# Patient Record
Sex: Male | Born: 1980 | Race: Black or African American | Hispanic: No | Marital: Married | Smoking: Current every day smoker
Health system: Southern US, Community
[De-identification: ages and names within clinical notes are randomized; demographics above are authoritative.]

## PROBLEM LIST (undated history)

## (undated) DIAGNOSIS — J302 Other seasonal allergic rhinitis: Secondary | ICD-10-CM

## (undated) DIAGNOSIS — A6000 Herpesviral infection of urogenital system, unspecified: Secondary | ICD-10-CM

---

## 2009-10-01 ENCOUNTER — Emergency Department (HOSPITAL_COMMUNITY): Admission: EM | Admit: 2009-10-01 | Discharge: 2009-10-01 | Payer: Self-pay | Admitting: Emergency Medicine

## 2009-11-05 ENCOUNTER — Emergency Department (HOSPITAL_COMMUNITY): Admission: EM | Admit: 2009-11-05 | Discharge: 2009-11-05 | Payer: Self-pay | Admitting: Emergency Medicine

## 2010-04-14 LAB — URINALYSIS, ROUTINE W REFLEX MICROSCOPIC
Bilirubin Urine: NEGATIVE
Glucose, UA: NEGATIVE mg/dL
Hgb urine dipstick: NEGATIVE
Specific Gravity, Urine: 1.025 (ref 1.005–1.030)
pH: 6 (ref 5.0–8.0)

## 2010-04-14 LAB — GC/CHLAMYDIA PROBE AMP, GENITAL
Chlamydia, DNA Probe: NEGATIVE
GC Probe Amp, Genital: NEGATIVE

## 2010-10-25 ENCOUNTER — Inpatient Hospital Stay (INDEPENDENT_AMBULATORY_CARE_PROVIDER_SITE_OTHER)
Admission: RE | Admit: 2010-10-25 | Discharge: 2010-10-25 | Disposition: A | Discharge status: Home / Self Care | Payer: Self-pay | Source: Ambulatory Visit | Attending: Family Medicine | Admitting: Family Medicine

## 2010-10-25 DIAGNOSIS — J309 Allergic rhinitis, unspecified: Secondary | ICD-10-CM

## 2011-01-06 ENCOUNTER — Emergency Department (HOSPITAL_BASED_OUTPATIENT_CLINIC_OR_DEPARTMENT_OTHER)
Admission: EM | Admit: 2011-01-06 | Discharge: 2011-01-06 | Disposition: A | Discharge status: Home / Self Care | Payer: Self-pay | Attending: Emergency Medicine | Admitting: Emergency Medicine

## 2011-01-06 ENCOUNTER — Encounter: Payer: Self-pay | Admitting: Emergency Medicine

## 2011-01-06 ENCOUNTER — Emergency Department (INDEPENDENT_AMBULATORY_CARE_PROVIDER_SITE_OTHER): Payer: Self-pay

## 2011-01-06 DIAGNOSIS — S139XXA Sprain of joints and ligaments of unspecified parts of neck, initial encounter: Secondary | ICD-10-CM | POA: Insufficient documentation

## 2011-01-06 DIAGNOSIS — Y92410 Unspecified street and highway as the place of occurrence of the external cause: Secondary | ICD-10-CM | POA: Insufficient documentation

## 2011-01-06 DIAGNOSIS — S161XXA Strain of muscle, fascia and tendon at neck level, initial encounter: Secondary | ICD-10-CM

## 2011-01-06 DIAGNOSIS — M545 Low back pain: Secondary | ICD-10-CM

## 2011-01-06 DIAGNOSIS — M549 Dorsalgia, unspecified: Secondary | ICD-10-CM | POA: Insufficient documentation

## 2011-01-06 DIAGNOSIS — M542 Cervicalgia: Secondary | ICD-10-CM

## 2011-01-06 MED ORDER — HYDROCODONE-ACETAMINOPHEN 5-500 MG PO TABS: 1.0000 | ORAL_TABLET | Freq: Four times a day (QID) | ORAL | Status: AC | PRN

## 2011-01-06 NOTE — ED Notes (Signed)
Patient transported to X-ray 
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
## 2012-01-11 ENCOUNTER — Emergency Department (HOSPITAL_COMMUNITY)
Admission: EM | Admit: 2012-01-11 | Discharge: 2012-01-11 | Disposition: A | Discharge status: Home / Self Care | Payer: Self-pay | Attending: Emergency Medicine | Admitting: Emergency Medicine

## 2012-01-11 ENCOUNTER — Emergency Department (HOSPITAL_COMMUNITY): Payer: Self-pay

## 2012-01-11 ENCOUNTER — Encounter (HOSPITAL_COMMUNITY): Payer: Self-pay

## 2012-01-11 DIAGNOSIS — F17200 Nicotine dependence, unspecified, uncomplicated: Secondary | ICD-10-CM | POA: Insufficient documentation

## 2012-01-11 DIAGNOSIS — R05 Cough: Secondary | ICD-10-CM | POA: Insufficient documentation

## 2012-01-11 DIAGNOSIS — J069 Acute upper respiratory infection, unspecified: Secondary | ICD-10-CM | POA: Insufficient documentation

## 2012-01-11 DIAGNOSIS — J029 Acute pharyngitis, unspecified: Secondary | ICD-10-CM | POA: Insufficient documentation

## 2012-01-11 DIAGNOSIS — Z8619 Personal history of other infectious and parasitic diseases: Secondary | ICD-10-CM | POA: Insufficient documentation

## 2012-01-11 DIAGNOSIS — R059 Cough, unspecified: Secondary | ICD-10-CM | POA: Insufficient documentation

## 2012-01-11 HISTORY — DX: Other seasonal allergic rhinitis: J30.2

## 2012-01-11 HISTORY — DX: Herpesviral infection of urogenital system, unspecified: A60.00

## 2012-01-11 LAB — RAPID STREP SCREEN (MED CTR MEBANE ONLY): Streptococcus, Group A Screen (Direct): NEGATIVE

## 2012-01-11 MED ORDER — DESLORATADINE 5 MG PO TBDP: 5.0000 mg | ORAL_TABLET | Freq: Every day | ORAL | Status: DC

## 2012-01-11 MED ORDER — ALBUTEROL SULFATE HFA 108 (90 BASE) MCG/ACT IN AERS: 2.0000 | INHALATION_SPRAY | RESPIRATORY_TRACT | Status: DC | PRN

## 2012-01-11 MED ORDER — FLUTICASONE PROPIONATE 50 MCG/ACT NA SUSP: 2.0000 | Freq: Every day | NASAL | Status: DC

## 2012-01-11 MED ORDER — BENZONATATE 100 MG PO CAPS: 100.0000 mg | ORAL_CAPSULE | Freq: Three times a day (TID) | ORAL | Status: DC | PRN

## 2012-01-11 NOTE — ED Provider Notes (Signed)
History     CSN: [id number]  Arrival date & time 01/11/12  0818   First MD Initiated Contact with Patient 01/11/12 [phone]      Chief Complaint  Patient presents with  . Nasal Congestion     HPI Pt was seen at 0850.  Per pt, c/o gradual onset and persistence of constant runny/stuffy nose, sore throat, sinus congestion, and cough for the past 2 weeks.  Has had intermittent epistaxis.  Denies fevers, no rash, no CP/SOB, no abd pain, no N/V/D.     Past Medical History  Diagnosis Date  . Seasonal allergies   . Genital herpes     History reviewed. No pertinent past surgical history.    History  Substance Use Topics  . Smoking status: Current Every Day Smoker -- 0.5 packs/day  . Smokeless tobacco: Not on file  . Alcohol Use: No    Review of Systems ROS: Statement: All systems negative except as marked or noted in the HPI; Constitutional: Negative for fever and chills. ; ; Eyes: Negative for eye pain, redness and discharge. ; ; ENMT: Negative for ear pain, hoarseness, +nasal congestion, sinus pressure and sore throat. ; ; Cardiovascular: Negative for chest pain, palpitations, diaphoresis, dyspnea and peripheral edema. ; ; Respiratory: +cough. Negative for wheezing and stridor. ; ; Gastrointestinal: Negative for nausea, vomiting, diarrhea, abdominal pain, blood in stool, hematemesis, jaundice and rectal bleeding. . ; ; Genitourinary: Negative for dysuria, flank pain and hematuria. ; ; Musculoskeletal: Negative for back pain and neck pain. Negative for swelling and trauma.; ; Skin: Negative for pruritus, rash, abrasions, blisters, bruising and skin lesion.; ; Neuro: Negative for headache, lightheadedness and neck stiffness. Negative for weakness, altered level of consciousness , altered mental status, extremity weakness, paresthesias, involuntary movement, seizure and syncope.      Allergies  Penicillins  Home Medications   Current Outpatient Rx  Name  Route  Sig  Dispense  Refill   . PHENYLEPH-CPM-DM-APAP 05-31-08-325 MG PO CAPS   Oral   Take 1 capsule by mouth 2 (two) times daily as needed. For cold           BP 131/71  Pulse 85  Temp 98.2 F (36.8 C) (Oral)  Resp 16  SpO2 100%  Physical Exam 0855: Physical examination:  Nursing notes reviewed; Vital signs and O2 SAT reviewed;  Constitutional: Well developed, Well nourished, Well hydrated, In no acute distress; Head:  Normocephalic, atraumatic; Eyes: EOMI, PERRL, No scleral icterus; ENMT: +clear fluid behind TM's bilat. +edemetous nasal turbinates bilat with clear rhinorrhea and friable mucosa.  Mouth and pharynx without lesions. No tonsillar exudates. No intra-oral edema. No hoarse voice, no drooling, no stridor.  Mouth and pharynx normal, Mucous membranes moist; Neck: Supple, Full range of motion, No lymphadenopathy; Cardiovascular: Regular rate and rhythm, No murmur, rub, or gallop; Respiratory: Breath sounds clear & equal bilaterally, No rales, rhonchi, wheezes.  Speaking full sentences with ease, Normal respiratory effort/excursion; Chest: Nontender, Movement normal;; Extremities: Pulses normal, No tenderness, No edema, No calf edema or asymmetry.; Neuro: AA&Ox3, Major CN grossly intact.  Speech clear. Gait steady. No gross focal motor or sensory deficits in extremities.; Skin: Color normal, Warm, Dry.   ED Course  Procedures   0900:  Pt demanding a "CT scan" of his head and chest for his symptoms.  Lengthy d/w pt regarding the lack of indication for this.  Again demanding "testing" because he "knows my body" and "think I have a tumor" or "walking pneumonia."  Reiterated there was no indication for imaging studies of his sinuses at this time, as well as no literature based recommendations regarding same.  Informed I will check a CXR and rapid strep to r/o pneumonia and strep throat based on his symptoms.  Pt argumentative and insistent he "needs more testing than that" for his symptoms.  Again informed that I will  check rapid strep and CXR and no further testing is indicated at this time.      1000:  No pneumonia or strep throat; will tx symptomatically for URI/viral illness at this time. Dx and testing d/w pt and family.  Questions answered.  Verb understanding, agreeable to d/c home with outpt f/u.   MDM  MDM Reviewed: previous chart, nursing note and vitals Interpretation: x-ray and labs    Results for orders placed during the hospital encounter of 01/11/12  RAPID STREP SCREEN      Component Value Range   Streptococcus, Group A Screen (Direct) NEGATIVE  NEGATIVE   Dg Chest 2 View 01/11/2012  *RADIOLOGY REPORT*  Clinical Data: Cough.  CHEST - 2 VIEW  Comparison: November 05, 2009.  Findings: Cardiomediastinal silhouette appears normal.  No acute pulmonary disease is noted.  Bony thorax is intact.  IMPRESSION: No acute cardiopulmonary abnormality seen.   Original Report Authenticated By: Lupita Raider.,  M.D.           Laray Anger, DO 01/12/12 1347

## 2012-01-11 NOTE — ED Notes (Signed)
Pt with c/o 2 weeks of congestion, green, muus, chest congestion mild SOB

## 2012-01-12 ENCOUNTER — Emergency Department (HOSPITAL_COMMUNITY): Payer: Self-pay

## 2012-01-12 ENCOUNTER — Encounter (HOSPITAL_COMMUNITY): Payer: Self-pay | Admitting: Emergency Medicine

## 2012-01-12 ENCOUNTER — Emergency Department (HOSPITAL_COMMUNITY)
Admission: EM | Admit: 2012-01-12 | Discharge: 2012-01-12 | Disposition: A | Discharge status: Home / Self Care | Payer: Self-pay | Attending: Emergency Medicine | Admitting: Emergency Medicine

## 2012-01-12 DIAGNOSIS — Z79899 Other long term (current) drug therapy: Secondary | ICD-10-CM | POA: Insufficient documentation

## 2012-01-12 DIAGNOSIS — F17200 Nicotine dependence, unspecified, uncomplicated: Secondary | ICD-10-CM | POA: Insufficient documentation

## 2012-01-12 DIAGNOSIS — Z8619 Personal history of other infectious and parasitic diseases: Secondary | ICD-10-CM | POA: Insufficient documentation

## 2012-01-12 DIAGNOSIS — R0602 Shortness of breath: Secondary | ICD-10-CM | POA: Insufficient documentation

## 2012-01-12 DIAGNOSIS — J40 Bronchitis, not specified as acute or chronic: Secondary | ICD-10-CM | POA: Insufficient documentation

## 2012-01-12 DIAGNOSIS — J209 Acute bronchitis, unspecified: Secondary | ICD-10-CM

## 2012-01-12 MED ORDER — AZITHROMYCIN 250 MG PO TABS: 250.0000 mg | ORAL_TABLET | Freq: Every day | ORAL | Status: DC

## 2012-01-12 NOTE — ED Notes (Signed)
Patient transported to X-ray 

## 2012-01-12 NOTE — ED Provider Notes (Addendum)
History     CSN: [id number]  Arrival date & time 01/12/12  2013   First MD Initiated Contact with Patient 01/12/12 2212      Chief Complaint  Patient presents with  . Cough  . Shortness of Breath    (Consider location/radiation/quality/duration/timing/severity/associated sxs/prior treatment) HPI  Gavon Majano is a 31 y.o. male complaining of productive cough and rhinorrhea onset 2 days ago. Patient had fever on the first day of symptoms now resolved. He denies chest pain, shortness of breath, abdominal pain, nausea vomiting, change in bowel or bladder habits.   Past Medical History  Diagnosis Date  . Seasonal allergies   . Genital herpes     History reviewed. No pertinent past surgical history.  No family history on file.  History  Substance Use Topics  . Smoking status: Current Every Day Smoker -- 0.5 packs/day  . Smokeless tobacco: Not on file  . Alcohol Use: No      Review of Systems  Constitutional: Negative for fever.  HENT: Positive for rhinorrhea.   Respiratory: Positive for cough. Negative for shortness of breath.   Cardiovascular: Negative for chest pain.  Gastrointestinal: Negative for nausea, vomiting, abdominal pain and diarrhea.  All other systems reviewed and are negative.    Allergies  Penicillins  Home Medications   Current Outpatient Rx  Name  Route  Sig  Dispense  Refill  . ALBUTEROL SULFATE HFA 108 (90 BASE) MCG/ACT IN AERS   Inhalation   Inhale 2 puffs into the lungs every 4 (four) hours as needed for wheezing or shortness of breath (cough).   1 Inhaler   0   . BENZONATATE 100 MG PO CAPS   Oral   Take 100 mg by mouth 3 (three) times daily as needed.         . DESLORATADINE 5 MG PO TBDP   Oral   Take 1 tablet (5 mg total) by mouth daily.   15 tablet   0   . FLUTICASONE PROPIONATE 50 MCG/ACT NA SUSP   Nasal   Place 2 sprays into the nose daily.   16 g   0   . PHENYLEPH-CPM-DM-APAP 05-31-08-325 MG PO CAPS   Oral   Take 1  capsule by mouth 2 (two) times daily as needed. For cold           Temp 99.4 F (37.4 C) (Oral)  Physical Exam  Nursing note and vitals reviewed. Constitutional: He is oriented to person, place, and time. He appears well-developed and well-nourished. No distress.  HENT:  Head: Normocephalic.  Mouth/Throat: Oropharynx is clear and moist.       Profuse clear rhinorrhea, posterior pharynx is injected, anterior cervical lymphadenopathy nontender to palpation.  Eyes: Conjunctivae normal and EOM are normal. Pupils are equal, round, and reactive to light.  Neck: Normal range of motion.  Cardiovascular: Normal rate and intact distal pulses.   No murmur heard. Pulmonary/Chest: Effort normal and breath sounds normal. No stridor. No respiratory distress. He has no wheezes. He has no rales. He exhibits no tenderness.  Abdominal: Soft. He exhibits no distension and no mass. There is no tenderness. There is no rebound and no guarding.  Musculoskeletal: Normal range of motion.  Neurological: He is alert and oriented to person, place, and time.  Psychiatric: He has a normal mood and affect.    ED Course  Procedures (including critical care time)  Labs Reviewed - No data to display Dg Chest 2 View  01/12/2012  *  RADIOLOGY REPORT*  Clinical Data: Cough, congestion, shortness of breath, mid chest pain  CHEST - 2 VIEW  Comparison: 01/11/2012, 11/05/2009  Findings: Normal heart size, mediastinal contours, and pulmonary vascularity. Minimal chronic peribronchial thickening. No infiltrate, pleural effusion, or pneumothorax. No acute osseous findings.  IMPRESSION: Minimal chronic bronchitic changes. No acute abnormalities.   Original Report Authenticated By: Ulyses Southward, M.D.    Dg Chest 2 View  01/11/2012  *RADIOLOGY REPORT*  Clinical Data: Cough.  CHEST - 2 VIEW  Comparison: November 05, 2009.  Findings: Cardiomediastinal silhouette appears normal.  No acute pulmonary disease is noted.  Bony thorax is  intact.  IMPRESSION: No acute cardiopulmonary abnormality seen.   Original Report Authenticated By: Lupita Raider.,  M.D.      1. Acute bronchitis       MDM  Reassured patient that I a shortness of breath is perception of his upper respiratory mucus. Patient states that he has albuterol and a cough suppressant at home. I have given him a prescription for azithromycin and asked him to not fill the prescription unless he is unimproved in 7 days or if she significantly worsens.   Pt verbalized understanding and agrees with care plan. Outpatient follow-up and return precautions given.     New Prescriptions   AZITHROMYCIN (ZITHROMAX Z-PAK) 250 MG TABLET    Take 1 tablet (250 mg total) by mouth daily. 500mg  PO day 1, then 250mg  PO days [address] PA-C 01/12/12 2235  Wynetta Emery, PA-C 02/06/12 1219

## 2012-01-12 NOTE — ED Notes (Signed)
Pt alert, arrives from home, c/o cough and congestion, onset several days ago, recent fever, per family productive cough, home remedies w/o relief, resp even unlabored, skin pwd

## 2012-01-14 NOTE — ED Provider Notes (Signed)
Medical screening examination/treatment/procedure(s) were performed by non-physician practitioner and as supervising physician I was immediately available for consultation/collaboration.  Tylynn Braniff T Kepler Mccabe, MD 01/14/12 1516 

## 2012-02-11 NOTE — ED Provider Notes (Signed)
Medical screening examination/treatment/procedure(s) were performed by non-physician practitioner and as supervising physician I was immediately available for consultation/collaboration.  Toy Baker, MD 02/11/12 2139

## 2016-07-09 ENCOUNTER — Encounter (HOSPITAL_COMMUNITY): Payer: Self-pay | Admitting: *Deleted

## 2016-07-09 ENCOUNTER — Ambulatory Visit (HOSPITAL_COMMUNITY)
Admission: EM | Admit: 2016-07-09 | Discharge: 2016-07-09 | Disposition: A | Discharge status: Home / Self Care | Payer: Self-pay | Attending: Internal Medicine | Admitting: Internal Medicine

## 2016-07-09 DIAGNOSIS — M5441 Lumbago with sciatica, right side: Secondary | ICD-10-CM

## 2016-07-09 MED ORDER — MELOXICAM 15 MG PO TABS
15.0000 mg | ORAL_TABLET | Freq: Every day | ORAL | 0 refills | Status: DC
Start: 2016-07-09 — End: 2021-11-19

## 2016-07-09 MED ORDER — METHOCARBAMOL 500 MG PO TABS: 500.0000 mg | ORAL_TABLET | Freq: Two times a day (BID) | ORAL | 0 refills | Status: DC

## 2016-07-09 NOTE — Discharge Instructions (Signed)
°  Meloxicam (Mobic) is an antiinflammatory to help with pain and inflammation.  Do not take ibuprofen, Advil, Aleve, or any other medications that contain NSAIDs while taking meloxicam as this may cause stomach upset or even ulcers if taken in large amounts for an extended period of time.  ° °Robaxin (methocarbamol) is a muscle relaxer and may cause drowsiness. Do not drink alcohol, drive, or operate heavy machinery while taking. ° °

## 2016-07-09 NOTE — ED Triage Notes (Signed)
Pt  Reports  Back    And      Pain  Radiates     Down  r  Leg     Symptoms    X  approx  10  Days       Denies  Any  specefic      Injury

## 2016-07-09 NOTE — ED Provider Notes (Signed)
CSN: [id number]     Arrival date & time 07/09/16  1211 History   First MD Initiated Contact with Patient 07/09/16 1316     Chief Complaint  Patient presents with  . Back Pain   (Consider location/radiation/quality/duration/timing/severity/associated sxs/prior Treatment) HPI  Isaiah Benson is a 36 y.o. male presenting to UC with c/o 10 days of waxing and waning Right lower back pain that radiates into Right buttock and Right posterior thigh.  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 Pain is 6/10 at this time but up to 8/10 at worst. Worse when trying to stretch his Right leg.  No specific known injury. He has not tried any OTC medications.  Denies urinary symptoms. Denies numbness or tingling in legs or groin.    Past Medical History:  Diagnosis Date  . Genital herpes   . Seasonal allergies    History reviewed. No pertinent surgical history. History reviewed. No pertinent family history. Social History  Substance Use Topics  . Smoking status: Current Every Day Smoker    Packs/day: 0.50  . Smokeless tobacco: Never Used  . Alcohol use No    Review of Systems  Genitourinary: Negative for dysuria, flank pain, frequency and hematuria.  Musculoskeletal: Positive for back pain and myalgias. Negative for arthralgias, gait problem, neck pain and neck stiffness.  Skin: Negative for color change, rash and wound.  Neurological: Negative for weakness and numbness.    Allergies  Penicillins  Home Medications   Prior to Admission medications   Medication Sig Start Date End Date Taking? Authorizing Provider  albuterol (PROVENTIL HFA;VENTOLIN HFA) 108 (90 BASE) MCG/ACT inhaler Inhale 2 puffs into the lungs every 4 (four) hours as needed for wheezing or shortness of breath (cough). 01/11/12   Samuel JesterMcManus, Kathleen, DO  azithromycin (ZITHROMAX Z-PAK) 250 MG tablet Take 1 tablet (250 mg total) by mouth  daily. 500mg  PO day 1, then 250mg  PO days 205 01/12/12   Pisciotta, Joni ReiningNicole, PA-C  benzonatate (TESSALON) 100 MG capsule Take 100 mg by mouth 3 (three) times daily as needed. 01/11/12   Samuel JesterMcManus, Kathleen, DO  desloratadine (CLARINEX REDITABS) 5 MG disintegrating tablet Take 1 tablet (5 mg total) by mouth daily. 01/11/12   Samuel JesterMcManus, Kathleen, DO  fluticasone (FLONASE) 50 MCG/ACT nasal spray Place 2 sprays into the nose daily. 01/11/12   Samuel JesterMcManus, Kathleen, DO  meloxicam (MOBIC) 15 MG tablet Take 1 tablet (15 mg total) by mouth daily. For 7 days, then daily as needed for pain 07/09/16   Junius Finner'Malley, Juan Kissoon, PA-C  methocarbamol (ROBAXIN) 500 MG tablet Take 1 tablet (500 mg total) by mouth 2 (two) times daily. 07/09/16   Junius Finner'Malley, Julieann Drummonds, PA-C  Phenyleph-CPM-DM-APAP (ALKA-SELTZER PLUS COLD & COUGH) 05-31-08-325 MG CAPS Take 1 capsule by mouth 2 (two) times daily as needed. For cold    [provider]   Meds Ordered and Administered this Visit  Medications - No data to display  BP 123/81 (BP Location: Right Arm)   Pulse 71   Temp 98.3 F (36.8 C) (Oral)   Resp 16   SpO2 98%  No data found.   Physical Exam  Constitutional: He is oriented to person, place, and time. He appears well-developed and well-nourished. No distress.  HENT:  Head: Normocephalic and atraumatic.  Eyes: EOM are normal.  Neck: Normal range of motion. Neck supple.  Cardiovascular: Normal rate.   Pulmonary/Chest: Effort normal.  Musculoskeletal:  Normal range of motion. He exhibits tenderness. He exhibits no edema.  No midline spinal tenderness. Tenderness to Right lower lumbar muscles, Right buttock and posterior thigh. Increased pain with flexion at the waist.  Right knee: non-tender. Full ROM  Neurological: He is alert and oriented to person, place, and time.  Skin: Skin is warm and dry. Capillary refill takes less than 2 seconds. No rash noted. He is not diaphoretic. No erythema.  Psychiatric: He has a normal mood and  affect. His behavior is normal.  Nursing note and vitals reviewed.   Urgent Care Course     Procedures (including critical care time)  Labs Review Labs Reviewed - No data to display  Imaging Review No results found.   MDM   1. Acute right-sided low back pain with right-sided sciatica    Hx and exam c/w Right low back pain with sciatica. No indication for imaging at this time. Will treat conservatively.   Rx: Meloxicam and Robaxin Home care instructions provied Advised pt not to take Robaxin within 24 hours of driving his semi-truck due to drowsiness  F/u with PCP as needed. Pt agreeable.     Junius Finner, PA-C 07/09/16 1355

## 2018-05-25 ENCOUNTER — Emergency Department (HOSPITAL_BASED_OUTPATIENT_CLINIC_OR_DEPARTMENT_OTHER): Payer: Self-pay

## 2018-05-25 ENCOUNTER — Encounter (HOSPITAL_BASED_OUTPATIENT_CLINIC_OR_DEPARTMENT_OTHER): Payer: Self-pay | Admitting: *Deleted

## 2018-05-25 ENCOUNTER — Emergency Department (HOSPITAL_BASED_OUTPATIENT_CLINIC_OR_DEPARTMENT_OTHER)
Admission: EM | Admit: 2018-05-25 | Discharge: 2018-05-25 | Disposition: A | Discharge status: Home / Self Care | Payer: Self-pay | Attending: Emergency Medicine | Admitting: Emergency Medicine

## 2018-05-25 ENCOUNTER — Other Ambulatory Visit: Payer: Self-pay

## 2018-05-25 DIAGNOSIS — R053 Chronic cough: Secondary | ICD-10-CM

## 2018-05-25 DIAGNOSIS — R05 Cough: Secondary | ICD-10-CM | POA: Insufficient documentation

## 2018-05-25 DIAGNOSIS — F17210 Nicotine dependence, cigarettes, uncomplicated: Secondary | ICD-10-CM | POA: Insufficient documentation

## 2018-05-25 NOTE — ED Notes (Signed)
Pt denies sob except at night when lying down. Pt states he uses vaporizer at home, seems to respond best to opening window with fan and using benadryl at bedtime to help sleep. Denis hx of asthma, states he was prescribed inhaler in past for URI, and allergy symptoms. Denis use at this time.

## 2018-05-25 NOTE — ED Triage Notes (Signed)
Pt since January he has had cough and SOB worse at night. Denies fever. 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 Denies fever. States he is isolated when working in his truck

## 2018-05-25 NOTE — ED Provider Notes (Signed)
[facility] EMERGENCY DEPARTMENT Provider Note   CSN: [id number] Arrival date & time: 05/25/18  1757    History   Chief Complaint Chief Complaint  Patient presents with  . Shortness of Breath    HPI Isaiah Benson is a 38 y.o. male.     HPI  38 year old male presents with cough and shortness of breath.  Symptoms have been ongoing since January 2020.  It is not better or worse.  The cough only occurs at night and occasionally will bring up clear sputum.  He does not specifically feel short of breath but more like he cannot get a full breath.  There is no blood in his sputum.  There is no fever or weight loss.  No URI symptoms such as congestion, sore throat or rhinorrhea.  Sometimes his chest will feel heavy.  None of the symptoms are occurring currently.  No leg swelling or leg pain.  He is a smoker.  Past Medical History:  Diagnosis Date  . Genital herpes   . Seasonal allergies     There are no active problems to display for this patient.   History reviewed. No pertinent surgical history.      Home Medications    Prior to Admission medications   Medication Sig Start Date End Date Taking? Authorizing Provider  albuterol (PROVENTIL HFA;VENTOLIN HFA) 108 (90 BASE) MCG/ACT inhaler Inhale 2 puffs into the lungs every 4 (four) hours as needed for wheezing or shortness of breath (cough). 01/11/12   Samuel Jester, DO  azithromycin (ZITHROMAX Z-PAK) 250 MG tablet Take 1 tablet (250 mg total) by mouth daily. 500mg  PO day 1, then 250mg  PO days 205 01/12/12   Pisciotta, Joni Reining, PA-C  benzonatate (TESSALON) 100 MG capsule Take 100 mg by mouth 3 (three) times daily as needed. 01/11/12   Samuel Jester, DO  desloratadine (CLARINEX REDITABS) 5 MG disintegrating tablet Take 1 tablet (5 mg total) by mouth daily. 01/11/12   Samuel Jester, DO  fluticasone (FLONASE) 50 MCG/ACT nasal spray Place 2 sprays into the nose daily. 01/11/12   Samuel Jester, DO  meloxicam  (MOBIC) 15 MG tablet Take 1 tablet (15 mg total) by mouth daily. For 7 days, then daily as needed for pain 07/09/16   Lurene Shadow, PA-C  methocarbamol (ROBAXIN) 500 MG tablet Take 1 tablet (500 mg total) by mouth 2 (two) times daily. 07/09/16   Lurene Shadow, PA-C  Phenyleph-CPM-DM-APAP (ALKA-SELTZER PLUS COLD & COUGH) 05-31-08-325 MG CAPS Take 1 capsule by mouth 2 (two) times daily as needed. For cold    [provider]    Family History No family history on file.  Social History Social History   Tobacco Use  . Smoking status: Current Every Day Smoker    Packs/day: 0.50    Types: Cigarettes  . Smokeless tobacco: Never Used  Substance Use Topics  . Alcohol use: Yes  . Drug use: No     Allergies   Penicillins   Review of Systems Review of Systems  Constitutional: Negative for fever and unexpected weight change.  HENT: Negative for congestion, rhinorrhea and sore throat.   Respiratory: Positive for cough and shortness of breath.   Cardiovascular: Negative for chest pain and leg swelling.  All other systems reviewed and are negative.    Physical Exam Updated Vital Signs BP (!) 142/83 (BP Location: Right Arm)   Pulse 62   Temp 98.4 F (36.9 C) (Oral)   Resp 18   Ht 6\' 4"  (  1.93 m)   Wt 90.7 kg   SpO2 100%   BMI 24.34 kg/m   Physical Exam Vitals signs and nursing note reviewed.  Constitutional:      Appearance: He is well-developed.  HENT:     Head: Normocephalic and atraumatic.     Right Ear: External ear normal.     Left Ear: External ear normal.     Nose: Nose normal.  Eyes:     General:        Right eye: No discharge.        Left eye: No discharge.  Neck:     Musculoskeletal: Neck supple.  Cardiovascular:     Rate and Rhythm: Normal rate and regular rhythm.     Heart sounds: Normal heart sounds.  Pulmonary:     Effort: Pulmonary effort is normal.     Breath sounds: Normal breath sounds.  Abdominal:     Palpations: Abdomen is soft.      Tenderness: There is no abdominal tenderness.  Skin:    General: Skin is warm and dry.  Neurological:     Mental Status: He is alert.  Psychiatric:        Mood and Affect: Mood is not anxious.      ED Treatments / Results  Labs (all labs ordered are listed, but only abnormal results are displayed) Labs Reviewed - No data to display  EKG None  Radiology Dg Chest 2 View  Result Date: 05/25/2018 CLINICAL DATA:  Cough and short of breath EXAM: CHEST - 2 VIEW COMPARISON:  01/12/2012 FINDINGS: The heart size and mediastinal contours are within normal limits. Both lungs are clear. The visualized skeletal structures are unremarkable. IMPRESSION: No active cardiopulmonary disease. Electronically Signed   By: Jasmine PangKim  Fujinaga M.D.   On: 05/25/2018 18:53    Procedures Procedures (including critical care time)  Medications Ordered in ED Medications - No data to display   Initial Impression / Assessment and Plan / ED Course  I have reviewed the triage vital signs and the nursing notes.  Pertinent labs & imaging results that were available during my care of the patient were reviewed by me and considered in my medical decision making (see chart for details).        Patient's exam and vitals are benign besides mild hypertension.  Cough has been present now for 3 months.  I advised him to stop smoking.  Given clear lungs and benign chest x-ray I do not think he needs further emergent work-up.  Given this only occurs at night, I think PE, ACS, etc. is pretty unlikely.  No true infectious signs or symptoms.  Will refer to pulmonology but otherwise counseled on stopping smoking.  Final Clinical Impressions(s) / ED Diagnoses   Final diagnoses:  Chronic cough    ED Discharge Orders    None       Pricilla LovelessGoldston, Venie Montesinos, MD 05/25/18 [phone]

## 2018-05-25 NOTE — ED Notes (Signed)
ED Provider at bedside. 

## 2019-08-10 ENCOUNTER — Encounter (HOSPITAL_BASED_OUTPATIENT_CLINIC_OR_DEPARTMENT_OTHER): Payer: Self-pay

## 2019-08-10 ENCOUNTER — Other Ambulatory Visit: Payer: Self-pay

## 2019-08-10 ENCOUNTER — Emergency Department (HOSPITAL_BASED_OUTPATIENT_CLINIC_OR_DEPARTMENT_OTHER): Payer: Self-pay

## 2019-08-10 ENCOUNTER — Emergency Department (HOSPITAL_BASED_OUTPATIENT_CLINIC_OR_DEPARTMENT_OTHER)
Admission: EM | Admit: 2019-08-10 | Discharge: 2019-08-10 | Disposition: A | Discharge status: Home / Self Care | Payer: Self-pay | Attending: Emergency Medicine | Admitting: Emergency Medicine

## 2019-08-10 DIAGNOSIS — R05 Cough: Secondary | ICD-10-CM | POA: Insufficient documentation

## 2019-08-10 DIAGNOSIS — R0602 Shortness of breath: Secondary | ICD-10-CM | POA: Insufficient documentation

## 2019-08-10 DIAGNOSIS — Z5321 Procedure and treatment not carried out due to patient leaving prior to being seen by health care provider: Secondary | ICD-10-CM | POA: Insufficient documentation

## 2019-08-10 NOTE — ED Triage Notes (Signed)
Pt arrives with c/o SOB X1 week. Pt also c/o cough. Also requesting a Covid test.

## 2019-08-10 NOTE — ED Notes (Signed)
Per registration, pt LWBS after triage 

## 2021-02-28 IMAGING — DX DG CHEST 1V PORT
1 series · 1 of 1 positions shown · non-contrast
Comparison: Chest radiographs 05/25/2018 and earlier.

CLINICAL DATA: 39-year-old male with cough congestion shortness of
breath and fever for 1 week.

EXAM:
PORTABLE CHEST 1 VIEW

[chest ap]
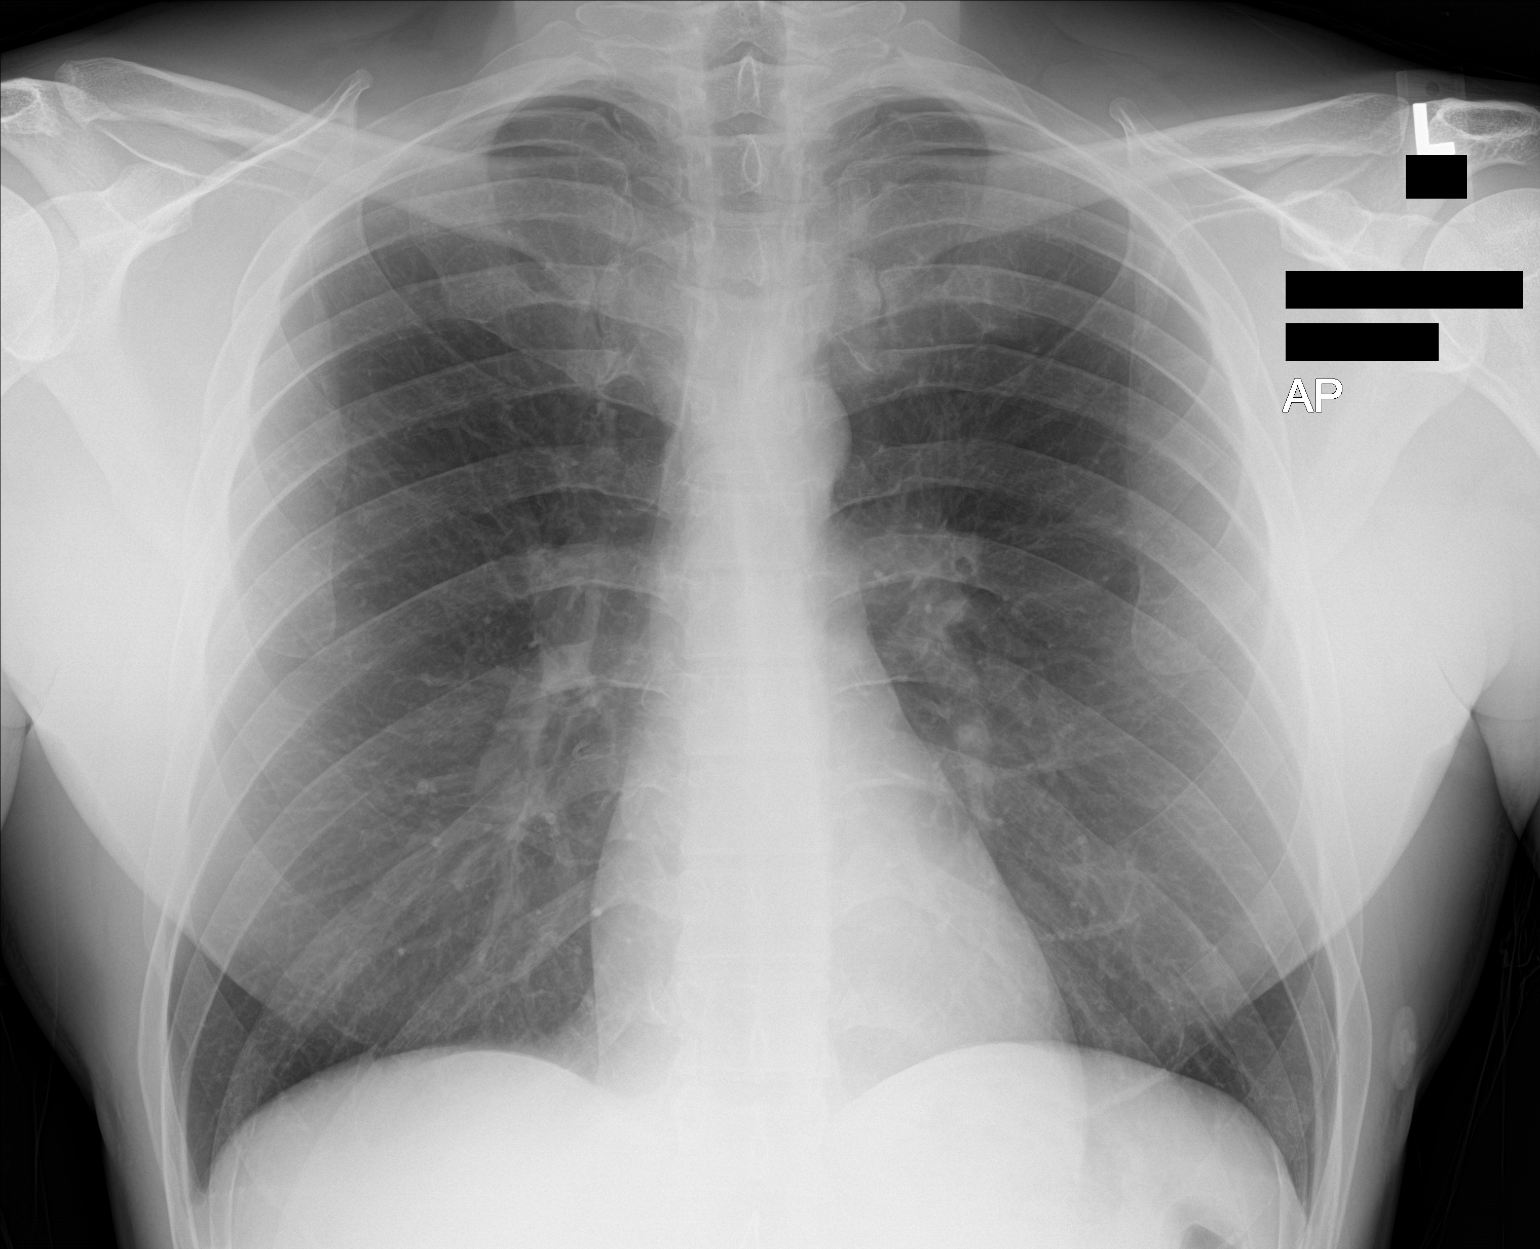

[1 of 1 positions shown; findings below may reference images not displayed]

FINDINGS: Portable AP upright view at 3799 hours. Lung volumes and mediastinal
contours remain normal. Visualized tracheal air column is within
normal limits. Allowing for portable technique the lungs are clear.
No pneumothorax or pleural effusion. No acute osseous abnormality
identified.
IMPRESSION: Negative portable chest.

## 2021-11-12 ENCOUNTER — Encounter (HOSPITAL_COMMUNITY): Payer: Self-pay | Admitting: Emergency Medicine

## 2021-11-12 ENCOUNTER — Ambulatory Visit (HOSPITAL_COMMUNITY)
Admission: EM | Admit: 2021-11-12 | Discharge: 2021-11-12 | Disposition: A | Discharge status: Home / Self Care | Payer: BC Managed Care – PPO | Attending: Emergency Medicine | Admitting: Emergency Medicine

## 2021-11-12 DIAGNOSIS — M5441 Lumbago with sciatica, right side: Secondary | ICD-10-CM | POA: Diagnosis not present

## 2021-11-12 DIAGNOSIS — S161XXA Strain of muscle, fascia and tendon at neck level, initial encounter: Secondary | ICD-10-CM

## 2021-11-12 MED ORDER — CYCLOBENZAPRINE HCL 10 MG PO TABS: 10.0000 mg | ORAL_TABLET | Freq: Every day | ORAL | 0 refills | Status: DC

## 2021-11-12 MED ORDER — NAPROXEN SODIUM 550 MG PO TABS: 550.0000 mg | ORAL_TABLET | Freq: Two times a day (BID) | ORAL | 0 refills | Status: DC
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
## 2021-11-12 NOTE — Discharge Instructions (Signed)
Your pain is most likely caused by irritation to the muscles.  Take naproxen every morning and every evening for 5 days then you may use as needed, this medicine reduces the inflammatory process and naturally occurs with injury, may use Tylenol in addition to this  May use muscle relaxer at bedtime as needed for additional comfort, be mindful this medication will make you drowsy  You may use heating pad in 15 minute intervals as needed for additional comfort, within the first 2-3 days you may find comfort in using ice in 10-15 minutes over affected area  Begin stretching affected area daily for 10 minutes as tolerated to further loosen muscles   When lying down place pillow underneath and between knees for support  Can try sleeping without pillow on firm mattress   Practice good posture: head back, shoulders back, chest forward, pelvis back and weight distributed evenly on both legs  If pain persist after recommended treatment or reoccurs if may be beneficial to follow up with orthopedic specialist for evaluation, this doctor specializes in the bones and can manage your symptoms long-term with options such as but not limited to imaging, medications or physical therapy
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
## 2021-11-19 ENCOUNTER — Ambulatory Visit (INDEPENDENT_AMBULATORY_CARE_PROVIDER_SITE_OTHER): Payer: BC Managed Care – PPO

## 2021-11-19 ENCOUNTER — Encounter (HOSPITAL_COMMUNITY): Payer: Self-pay | Admitting: *Deleted

## 2021-11-19 ENCOUNTER — Other Ambulatory Visit: Payer: Self-pay

## 2021-11-19 ENCOUNTER — Ambulatory Visit (HOSPITAL_COMMUNITY)
Admission: EM | Admit: 2021-11-19 | Discharge: 2021-11-19 | Disposition: A | Discharge status: Home / Self Care | Payer: BC Managed Care – PPO | Attending: Physician Assistant | Admitting: Physician Assistant

## 2021-11-19 DIAGNOSIS — M549 Dorsalgia, unspecified: Secondary | ICD-10-CM | POA: Diagnosis not present

## 2021-11-19 DIAGNOSIS — S161XXD Strain of muscle, fascia and tendon at neck level, subsequent encounter: Secondary | ICD-10-CM

## 2021-11-19 DIAGNOSIS — M546 Pain in thoracic spine: Secondary | ICD-10-CM

## 2021-11-19 DIAGNOSIS — M542 Cervicalgia: Secondary | ICD-10-CM

## 2021-11-19 DIAGNOSIS — M545 Low back pain, unspecified: Secondary | ICD-10-CM

## 2021-11-19 MED ORDER — METHOCARBAMOL 500 MG PO TABS: 500.0000 mg | ORAL_TABLET | Freq: Three times a day (TID) | ORAL | 0 refills | Status: DC | PRN

## 2021-11-19 MED ORDER — IBUPROFEN 800 MG PO TABS: 800.0000 mg | ORAL_TABLET | Freq: Three times a day (TID) | ORAL | 0 refills | Status: DC

## 2021-11-19 NOTE — Discharge Instructions (Signed)
Your x-rays did not show any acute fractures.  If you continue to have pain it may be worthwhile to have more advanced imaging but unfortunately we cannot arrange this in urgent care.  Please follow-up with sports medicine for further evaluation and management as they can help arrange additional treatment and imaging.  Call them to schedule an appointment.  Use ibuprofen 800 mg for pain.  Do not take additional NSAIDs with this medication including aspirin, ibuprofen/Advil, naproxen/Aleve.  Take Robaxin up to 3 times a day.  This make you sleepy so do not drive or drink alcohol with taking it.  Use heat, rest, stretch for additional symptom relief.  If you end up with any worsening symptoms including increased pain, going to the bathroom on yourself without noticing it, numbness/tingling in your arms or legs, weakness in any of your extremities you need to go to the emergency room.
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
## 2022-03-19 ENCOUNTER — Encounter (HOSPITAL_COMMUNITY): Payer: Self-pay | Admitting: *Deleted

## 2022-03-19 ENCOUNTER — Ambulatory Visit (HOSPITAL_COMMUNITY)
Admission: EM | Admit: 2022-03-19 | Discharge: 2022-03-19 | Disposition: A | Discharge status: Home / Self Care | Payer: BC Managed Care – PPO | Attending: Physician Assistant | Admitting: Physician Assistant

## 2022-03-19 ENCOUNTER — Other Ambulatory Visit: Payer: Self-pay

## 2022-03-19 DIAGNOSIS — R21 Rash and other nonspecific skin eruption: Secondary | ICD-10-CM | POA: Diagnosis not present

## 2022-03-19 MED ORDER — TRIAMCINOLONE ACETONIDE 0.1 % EX CREA: 1.0000 | TOPICAL_CREAM | Freq: Two times a day (BID) | CUTANEOUS | 0 refills | Status: AC

## 2022-03-19 MED ORDER — DOXYCYCLINE HYCLATE 100 MG PO CAPS: 100.0000 mg | ORAL_CAPSULE | Freq: Two times a day (BID) | ORAL | 0 refills | Status: AC

## 2022-03-19 NOTE — ED Provider Notes (Signed)
Isaiah Benson    CSN: [id number] Arrival date & time: 03/19/22  1311      History   Chief Complaint Chief Complaint  Patient presents with   Insect Bite    HPI Isaiah Benson is a 42 y.o. male.   Patient here today for evaluation of rash to right 5th finger that he reports he thinks is poison ivy. He noted similar rash to left arm after working outside. He thinks he might have also sustained a spider bite to his right 5th finger given small wound noted but does not report seeing a spider. He has not had fever. He denies any nausea or vomiting.   The history is provided by the patient.    Past Medical History:  Diagnosis Date   Genital herpes    Seasonal allergies     There are no problems to display for this patient.   History reviewed. No pertinent surgical history.     Home Medications    Prior to Admission medications   Medication Sig Start Date End Date Taking? Authorizing Provider  doxycycline (VIBRAMYCIN) 100 MG capsule Take 1 capsule (100 mg total) by mouth 2 (two) times daily. 03/19/22  Yes Francene Finders, PA-C  triamcinolone cream (KENALOG) 0.1 % Apply 1 Application topically 2 (two) times daily. 03/19/22  Yes Francene Finders, PA-C    Family History History reviewed. No pertinent family history.  Social History Social History   Tobacco Use   Smoking status: Every Day    Packs/day: 0.50    Types: Cigarettes   Smokeless tobacco: Never  Substance Use Topics   Alcohol use: Not Currently   Drug use: Yes    Types: Marijuana     Allergies   Penicillin g and Penicillins   Review of Systems Review of Systems  Constitutional:  Negative for chills and fever.  Eyes:  Negative for discharge and redness.  Respiratory:  Negative for shortness of breath.   Gastrointestinal:  Negative for nausea and vomiting.  Skin:  Positive for color change, rash and wound.  Neurological:  Negative for numbness.     Physical Exam Triage Vital Signs ED  Triage Vitals  Enc Vitals Group     BP 03/19/22 1432 (!) 156/85     Pulse Rate 03/19/22 1432 71     Resp 03/19/22 1432 18     Temp 03/19/22 1432 98 F (36.7 C)     Temp src --      SpO2 03/19/22 1432 98 %     Weight --      Height --      Head Circumference --      Peak Flow --      Pain Score 03/19/22 1429 0     Pain Loc --      Pain Edu? --      Excl. in [place]? --    No data found.  Updated Vital Signs BP (!) 156/85   Pulse 71   Temp 98 F (36.7 C)   Resp 18   SpO2 98%      Physical Exam Vitals and nursing note reviewed.  Constitutional:      General: He is not in acute distress.    Appearance: Normal appearance. He is not ill-appearing.  HENT:     Head: Normocephalic and atraumatic.  Eyes:     Conjunctiva/sclera: Conjunctivae normal.  Cardiovascular:     Rate and Rhythm: Normal rate.  Pulmonary:     Effort:  Pulmonary effort is normal. No respiratory distress.  Skin:    Comments: Erythematous vesicular rash in clusters to right 5th finger proximally, no rash to palm, few scattered healing vesicular lesions in linear pattern to left forearm. Mild swelling appreciated to right 5th finger with few superficial crusted wounds within rash.   Neurological:     Mental Status: He is alert.  Psychiatric:        Mood and Affect: Mood normal.        Behavior: Behavior normal.        Thought Content: Thought content normal.      UC Treatments / Results  Labs (all labs ordered are listed, but only abnormal results are displayed) Labs Reviewed - No data to display  EKG   Radiology No results found.  Procedures Procedures (including critical care time)  Medications Ordered in UC Medications - No data to display  Initial Impression / Assessment and Plan / UC Course  I have reviewed the triage vital signs and the nursing notes.  Pertinent labs & imaging results that were available during my care of the patient were reviewed by me and considered in my medical  decision making (see chart for details).    Rash is consistent with poison ivy however given appearance will treat to cover infection as well. Kenalog cream prescribed to hopefully help clear rash, and doxycycline prescribed for antibacterial coverage. Encouraged follow up fi no gradual improvement or with any further concerns.   Final Clinical Impressions(s) / UC Diagnoses   Final diagnoses:  Rash   Discharge Instructions   None    ED Prescriptions     Medication Sig Dispense Auth. Provider   triamcinolone cream (KENALOG) 0.1 % Apply 1 Application topically 2 (two) times daily. 30 g Francene Finders, PA-C   doxycycline (VIBRAMYCIN) 100 MG capsule Take 1 capsule (100 mg total) by mouth 2 (two) times daily. 20 capsule Francene Finders, PA-C      PDMP not reviewed this encounter.   Francene Finders, PA-C 03/19/22 1624

## 2022-03-19 NOTE — ED Triage Notes (Signed)
Pt may have a spider to RT pinky finger. Pt reports he was working in yard and has poison ivy on hand and arm
